# Patient Record
Sex: Male | Born: 2005
Health system: Southern US, Community
[De-identification: ages and names within clinical notes are randomized; demographics above are authoritative.]

## PROBLEM LIST (undated history)

## (undated) DIAGNOSIS — IMO0001 Reserved for inherently not codable concepts without codable children: Secondary | ICD-10-CM

## (undated) DIAGNOSIS — K219 Gastro-esophageal reflux disease without esophagitis: Secondary | ICD-10-CM

## (undated) HISTORY — DX: Reserved for inherently not codable concepts without codable children: IMO0001

## (undated) HISTORY — DX: Gastro-esophageal reflux disease without esophagitis: K21.9

## (undated) HISTORY — PX: BRONCHOSCOPY: SUR163

---
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
## 2023-01-06 DIAGNOSIS — Z23 Encounter for immunization: Secondary | ICD-10-CM | POA: Diagnosis not present

## 2023-01-06 DIAGNOSIS — R41840 Attention and concentration deficit: Secondary | ICD-10-CM | POA: Diagnosis not present

## 2023-01-06 DIAGNOSIS — Z0000 Encounter for general adult medical examination without abnormal findings: Secondary | ICD-10-CM | POA: Diagnosis not present

## 2023-03-17 DIAGNOSIS — L308 Other specified dermatitis: Secondary | ICD-10-CM | POA: Diagnosis not present

## 2023-03-17 DIAGNOSIS — L720 Epidermal cyst: Secondary | ICD-10-CM | POA: Diagnosis not present

## 2023-03-25 DIAGNOSIS — Z111 Encounter for screening for respiratory tuberculosis: Secondary | ICD-10-CM | POA: Diagnosis not present

## 2023-10-10 DIAGNOSIS — Z23 Encounter for immunization: Secondary | ICD-10-CM | POA: Diagnosis not present

## 2024-06-04 ENCOUNTER — Encounter: Payer: Self-pay | Admitting: Family Medicine

## 2024-06-07 DIAGNOSIS — L0390 Cellulitis, unspecified: Secondary | ICD-10-CM | POA: Diagnosis not present

## 2024-06-07 DIAGNOSIS — Z1159 Encounter for screening for other viral diseases: Secondary | ICD-10-CM | POA: Diagnosis not present

## 2024-06-07 DIAGNOSIS — Z111 Encounter for screening for respiratory tuberculosis: Secondary | ICD-10-CM | POA: Diagnosis not present

## 2024-06-07 DIAGNOSIS — B9562 Methicillin resistant Staphylococcus aureus infection as the cause of diseases classified elsewhere: Secondary | ICD-10-CM | POA: Diagnosis not present

## 2024-08-29 DIAGNOSIS — Z23 Encounter for immunization: Secondary | ICD-10-CM | POA: Diagnosis not present

## 2024-09-26 DIAGNOSIS — F411 Generalized anxiety disorder: Secondary | ICD-10-CM | POA: Diagnosis not present

## 2024-10-15 DIAGNOSIS — S51811A Laceration without foreign body of right forearm, initial encounter: Secondary | ICD-10-CM | POA: Diagnosis not present

## 2024-10-17 DIAGNOSIS — Z23 Encounter for immunization: Secondary | ICD-10-CM | POA: Diagnosis not present

## 2024-11-06 ENCOUNTER — Telehealth: Admitting: Physician Assistant

## 2024-11-06 DIAGNOSIS — R52 Pain, unspecified: Secondary | ICD-10-CM

## 2024-11-06 DIAGNOSIS — Z20828 Contact with and (suspected) exposure to other viral communicable diseases: Secondary | ICD-10-CM | POA: Diagnosis not present

## 2024-11-06 MED ORDER — ALBUTEROL SULFATE HFA 108 (90 BASE) MCG/ACT IN AERS: 2.0000 | INHALATION_SPRAY | Freq: Four times a day (QID) | RESPIRATORY_TRACT | 0 refills | Status: AC | PRN

## 2024-11-06 MED ORDER — OSELTAMIVIR PHOSPHATE 75 MG PO CAPS: 75.0000 mg | ORAL_CAPSULE | Freq: Two times a day (BID) | ORAL | 0 refills | Status: AC

## 2024-11-06 NOTE — Progress Notes (Signed)
 Message sent to patient requesting further input regarding current symptoms. Awaiting patient response.

## 2024-11-06 NOTE — Progress Notes (Signed)
 E visit for Flu like symptoms   We are sorry that you are not feeling well.  Here is how we plan to help! Based on what you have shared with me it looks like you may have a respiratory virus that may be influenza.  Influenza or "the flu" is  an infection caused by a respiratory virus. The flu virus is highly contagious and persons who did not receive their yearly flu vaccination may "catch" the flu from close contact.  We have anti-viral medications to treat the viruses that cause this infection. They are not a "cure" and only shorten the course of the infection. These prescriptions are most effective when they are given within the first 2 days of "flu" symptoms. Antiviral medications are indicated if you have a high risk of complications from the flu. You should  also consider an antiviral medication if you are in close contact with someone who is at risk. These medications can help patients avoid complications from the flu but have side effects that you should know.   Possible side effects from Tamiflu  or oseltamivir  include nausea, vomiting, diarrhea, dizziness, headaches, eye redness, sleep problems or other respiratory symptoms. You should not take Tamiflu  if you have an allergy to oseltamivir  or any to the ingredients in Tamiflu .  Based upon your symptoms and potential risk factors I have prescribed Oseltamivir  (Tamiflu ).  It has been sent to your designated pharmacy.  You will take one 75 mg capsule orally twice a day for the next 5 days.   For nasal congestion, you may use an oral decongestant such as Mucinex D or if you have glaucoma or high blood pressure use plain Mucinex.  Saline nasal spray or nasal drops can help and can safely be used as often as needed for congestion.  If you have a sore or scratchy throat, use a saltwater gargle-  to  teaspoon of salt dissolved in a 4-ounce to 8-ounce glass of warm water.  Gargle the solution for approximately 15-30 seconds and then spit.  It is  important not to swallow the solution.  You can also use throat lozenges/cough drops and Chloraseptic spray to help with throat pain or discomfort.  Warm or cold liquids can also be helpful in relieving throat pain.  For headache, pain or general discomfort, you can use Ibuprofen or Tylenol as directed.   Some authorities believe that zinc sprays or the use of Echinacea may shorten the course of your symptoms.  I have prescribed the following medications to help lessen symptoms: I have prescribed an inhaler - Albuterol  MDI 90 mcg /actuation 2 puffs every 4 hours as needed for shortness of breath, wheezing, cough  You are to isolate at home until you have been fever-free for at least 24 hours without a fever-reducing medication, and symptoms have been steadily improving for 24 hours.  If you must be around other household members who do not have symptoms, you need to make sure that both you and the family members are masking consistently with a high-quality mask.  If you note any worsening of symptoms despite treatment, please seek an in-person evaluation ASAP. If you note any significant shortness of breath or any chest pain, please seek ED evaluation. Please do not delay care!  ANYONE WHO HAS FLU SYMPTOMS SHOULD: Stay home. The flu is highly contagious and going out or to work exposes others! Be sure to drink plenty of fluids. Water is fine as well as fruit juices, sodas and electrolyte beverages.  You may want to stay away from caffeine or alcohol. If you are nauseated, try taking small sips of liquids. How do you know if you are getting enough fluid? Your urine should be a pale yellow or almost colorless. Get rest. Taking a steamy shower or using a humidifier may help nasal congestion and ease sore throat pain. Using a saline nasal spray works much the same way. Cough drops, hard candies and sore throat lozenges may ease your cough. Line up a caregiver. Have someone check on you regularly.  GET  HELP RIGHT AWAY IF: You cannot keep down liquids or your medications. You become short of breath Your fell like you are going to pass out or loose consciousness. Your symptoms persist after you have completed your treatment plan  MAKE SURE YOU  Understand these instructions. Will watch your condition. Will get help right away if you are not doing well or get worse.  Your e-visit answers were reviewed by a board certified advanced clinical practitioner to complete your personal care plan.  Depending on the condition, your plan could have included both over the counter or prescription medications.  If there is a problem please reply  once you have received a response from your provider.  Your safety is important to us .  If you have drug allergies check your prescription carefully.    You can use MyChart to ask questions about today's visit, request a non-urgent call back, or ask for a work or school excuse for 24 hours related to this e-Visit. If it has been greater than 24 hours you will need to follow up with your provider, or enter a new e-Visit to address those concerns.  You will get an e-mail in the next two days asking about your experience.  I hope that your e-visit has been valuable and will speed your recovery. Thank you for using e-visits.   I have spent 5 minutes in review of e-visit questionnaire, review and updating patient chart, medical decision making and response to patient.   Elsie Velma Lunger, PA-C

## 2025-01-17 ENCOUNTER — Encounter: Payer: Self-pay | Admitting: Physician Assistant

## 2025-01-17 ENCOUNTER — Ambulatory Visit: Admitting: Physician Assistant

## 2025-01-17 ENCOUNTER — Other Ambulatory Visit: Payer: Self-pay

## 2025-01-17 VITALS — BP 119/75 | HR 74 | Ht 72.0 in | Wt 192.4 lb

## 2025-01-17 DIAGNOSIS — Z23 Encounter for immunization: Secondary | ICD-10-CM

## 2025-01-17 DIAGNOSIS — Z7689 Persons encountering health services in other specified circumstances: Secondary | ICD-10-CM

## 2025-01-17 DIAGNOSIS — L309 Dermatitis, unspecified: Secondary | ICD-10-CM | POA: Insufficient documentation

## 2025-01-17 DIAGNOSIS — L308 Other specified dermatitis: Secondary | ICD-10-CM

## 2025-01-17 DIAGNOSIS — Z1159 Encounter for screening for other viral diseases: Secondary | ICD-10-CM

## 2025-01-17 DIAGNOSIS — Z114 Encounter for screening for human immunodeficiency virus [HIV]: Secondary | ICD-10-CM

## 2025-01-17 DIAGNOSIS — K219 Gastro-esophageal reflux disease without esophagitis: Secondary | ICD-10-CM

## 2025-01-17 DIAGNOSIS — Z136 Encounter for screening for cardiovascular disorders: Secondary | ICD-10-CM

## 2025-01-17 DIAGNOSIS — Z113 Encounter for screening for infections with a predominantly sexual mode of transmission: Secondary | ICD-10-CM | POA: Insufficient documentation

## 2025-01-17 DIAGNOSIS — Z7189 Other specified counseling: Secondary | ICD-10-CM | POA: Insufficient documentation

## 2025-01-17 DIAGNOSIS — F1290 Cannabis use, unspecified, uncomplicated: Secondary | ICD-10-CM

## 2025-01-17 MED ORDER — PANTOPRAZOLE SODIUM 40 MG PO TBEC
40.0000 mg | DELAYED_RELEASE_TABLET | Freq: Every day | ORAL | 3 refills | Status: AC
  Filled 2025-01-17: qty 30, 30d supply, fill #0

## 2025-01-17 MED ORDER — TRIAMCINOLONE ACETONIDE 0.5 % EX OINT
1.0000 | TOPICAL_OINTMENT | Freq: Two times a day (BID) | CUTANEOUS | 0 refills | Status: AC
  Filled 2025-01-17: qty 15, 30d supply, fill #0

## 2025-01-17 NOTE — Progress Notes (Signed)
 " New patient visit  Patient: Ronald Henderson   DOB: 08-Oct-2006   19 y.o. Male  MRN: [id number] Visit Date: 01/17/2025  Today's healthcare provider: Jolynn Spencer, PA-C   Chief Complaint  Patient presents with   Establish Care    New patient here to establish care Interested in Prep, Chronic Acid reflex    Subjective    [name] is a 19 y.o. male who presents today as a new patient to establish care.  HPI HPI     Establish Care    Additional comments: New patient here to establish care Interested in Prep, Chronic Acid reflex       Last edited by Debby Kinder on 01/17/2025  3:21 PM.      Discussed the use of AI scribe software for clinical note transcription with the patient, who gave verbal consent to proceed.  History of Present Illness Ronald Henderson is a 19 year old male who presents for establishment of care and discussion of chronic acid reflux and potential PrEP initiation.  He has chronic daily acid reflux since childhood, currently controlled with over-the-counter proton pump inhibitors. He is worried about long-term effects of daily antacid use and wants alternative options.  He has eczema on his hands with painful cracking and no itching. He has not been formally diagnosed and manages with Eucerin and gloves to limit chemical exposure.  He is interested in starting PrEP for HIV prevention and has never used PrEP. He is sexually active and did not report partners known to have HIV.  He is a theatre stage manager. He smokes marijuana about five times per week, recently quit nicotine after about four years, and drinks alcohol every other weekend with about six to seven drinks each time. 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 He denies chest pain, shortness of breath, palpitations, or exertional fatigue.    Past Medical History:  Diagnosis Date   Reflux    Past  Surgical History:  Procedure Laterality Date   BRONCHOSCOPY     Family Status  Relation Name Status   Other  (Not Specified)   Other  (Not Specified)  No partnership data on file   Family History  Problem Relation Age of Onset   Heart disease Other    Diabetes Other    Social History   Socioeconomic History   Marital status: Single    Spouse name: Not on file   Number of children: Not on file   Years of education: Not on file   Highest education level: Bachelor's degree (e.g., BA, AB, BS)  Occupational History   Not on file  Tobacco Use   Smoking status: Never   Smokeless tobacco: Never  Substance and Sexual Activity   Alcohol use: Yes    Comment: occ   Drug use: No   Sexual activity: Not on file  Other Topics Concern   Not on file  Social History Narrative   Not on file   Social Drivers of Health   Tobacco Use: Low Risk (01/17/2025)   Patient History    Smoking Tobacco Use: Never    Smokeless Tobacco Use: Never    Passive Exposure: Not on file  Financial Resource Strain: Low Risk (01/16/2025)   Overall Financial Resource Strain (CARDIA)    Difficulty of Paying Living Expenses: Not hard at all  Food Insecurity: No Food Insecurity (01/16/2025)   Epic  Worried About Programme Researcher, Broadcasting/film/video in the Last Year: Never true    Ran Out of Food in the Last Year: Never true  Transportation Needs: No Transportation Needs (01/16/2025)   Epic    Lack of Transportation (Medical): No    Lack of Transportation (Non-Medical): No  Physical Activity: Sufficiently Active (01/16/2025)   Exercise Vital Sign    Days of Exercise per Week: 6 days    Minutes of Exercise per Session: 40 min  Stress: Stress Concern Present (01/16/2025)   [employer] of Occupational [employer] - Occupational Stress Questionnaire    Feeling of Stress: To some extent  Social Connections: Socially Isolated (01/16/2025)   Social Connection and Isolation Panel    Frequency of Communication with Friends and Family:  More than three times a week    Frequency of Social Gatherings with Friends and Family: More than three times a week    Attends Religious Services: Never    Database Administrator or Organizations: No    Attends Engineer, Structural: Not on file    Marital Status: Never married  Depression (PHQ2-9): High Risk (01/17/2025)   Depression (PHQ2-9)    PHQ-2 Score: 13  Alcohol Screen: Medium Risk (01/16/2025)   Alcohol Screen    Last Alcohol Screening Score (AUDIT): 10  Housing: Unknown (01/16/2025)   Epic    Unable to Pay for Housing in the Last Year: No    Number of Times Moved in the Last Year: Not on file    Homeless in the Last Year: No  Utilities: Low Risk (11/18/2024)   Received from [facility]   Utilities    Within the past 12 months, have you been unable to get utilities(heat, electricity) when it was really needed?: No  Health Literacy: Not on file   Show/hide medication list[1] Allergies[2]  Immunization History  Administered Date(s) Administered   DTP 12/15/2005   DTaP 10/15/2004, 12/28/2004, 03/01/2005, 08/06/2009   HIB (PRP-OMP) 10/15/2004, 12/28/2004, 03/01/2005, 10/20/2005   HPV 9-valent 11/08/2016, 05/24/2017   Hepatitis A, Ped/Adol-2 Dose 10/29/2013, 07/15/2014   Hepatitis B 08/16/2004, 10/15/2004, 12/28/2004, 09/03/2009   IPV 10/15/2004, 12/28/2004, 03/01/2005, 08/06/2009   Influenza, Seasonal, Injecte, Preservative Fre 08/29/2024   Influenza,Quad,Nasal, Live 10/29/2013   Influenza,inj,Quad PF,6+ Mos 11/04/2014, 11/04/2015, 11/08/2016, 11/11/2017, 11/19/2019, 11/19/2020   Influenza-Unspecified 12/15/2005   MMR 10/20/2005, 08/06/2009   Meningococcal Conjugate 02/02/2016, 11/19/2020   PFIZER(Purple Top)SARS-COV-2 Vaccination 04/30/2020, 05/21/2020   PPD Test 10/20/2021, 03/14/2023   Pneumococcal Conjugate-13 10/15/2004, 12/28/2004, 03/01/2005, 10/20/2005   Td (Adult),5 Lf Tetanus Toxid, Preservative Free 10/17/2024   Tdap 02/02/2016   Varicella  12/15/2005, 10/29/2013    Health Maintenance  Topic Date Due   HPV VACCINES (3 - Risk male 3-dose series) 09/23/2017   HIV Screening  Never done   Meningococcal B Vaccine (1 of 2 - Standard) Never done   Hepatitis C Screening  Never done   COVID-19 Vaccine (3 - 2025-26 season) 08/13/2024   DTaP/Tdap/Td (8 - Td or Tdap) 10/17/2034   Pneumococcal Vaccine  Completed   Influenza Vaccine  Completed   Hepatitis B Vaccines 19-59 Average Risk  Completed    Patient Care Team: Raynee Mccasland, PA-C as PCP - General (Physician Assistant)  Review of Systems Except see HPI       Objective    BP 119/75 (BP Location: Left Arm, Patient Position: Sitting, Cuff Size: Normal)   Pulse 74   Ht 6' (1.829 m)   Wt 192 lb 6.4 oz (  87.3 kg)   SpO2 100%   BMI 26.09 kg/m     Physical Exam Vitals reviewed.  Constitutional:      General: He is not in acute distress.    Appearance: Normal appearance. He is not diaphoretic.  HENT:     Head: Normocephalic and atraumatic.  Eyes:     General: No scleral icterus.    Conjunctiva/sclera: Conjunctivae normal.  Cardiovascular:     Rate and Rhythm: Normal rate and regular rhythm.     Pulses: Normal pulses.     Heart sounds: Normal heart sounds. No murmur heard. Pulmonary:     Effort: Pulmonary effort is normal. No respiratory distress.     Breath sounds: Normal breath sounds. No wheezing or rhonchi.  Musculoskeletal:     Cervical back: Neck supple.     Right lower leg: No edema.     Left lower leg: No edema.  Lymphadenopathy:     Cervical: No cervical adenopathy.  Skin:    General: Skin is warm and dry.     Findings: No rash.  Neurological:     Mental Status: He is alert and oriented to person, place, and time. Mental status is at baseline.  Psychiatric:        Mood and Affect: Mood normal.        Behavior: Behavior normal.     Depression Screen    01/17/2025    3:28 PM 01/17/2025    3:23 PM 11/19/2020    4:31 PM 11/19/2019   11:08 AM   PHQ 2/9 Scores  PHQ - 2 Score 2 0 2 0  PHQ- 9 Score 13  6  0   Exception Documentation Medical reason Medical reason       Data saved with a previous flowsheet row definition   No results found for any visits on 01/17/25.  Assessment & Plan      Assessment & Plan Encounter for counseling before starting and about pre-exposure prophylaxis for HIV Pre-exposure prophylaxis (PrEP) initiation and HIV/STD screening He considered PrEP due to potential HIV exposure risk and was informed about its benefits. - Ordered HIV and STD screening labs. - Ordered hepatitis B screening. - Will initiate PrEP after lab results are reviewed.  Gastroesophageal reflux disease Chronic and unstable GERD with daily symptoms. Discussed potential side effects of long-term antacid use. - Prescribed pantoprazole  40 mg, start with half tablets. - Advised dietary modifications to avoid trigger foods. - Recommended lifestyle modifications such as not lying down immediately after eating and maintaining a 80% fullness level. Consider a referral to GI if symptoms persist or worsen Will follow-up  Dermatitis of hands Chronic dermatitis on hands, painful when cracking. Discussed the importance of using moisturizers and avoiding irritants. - Prescribed Triamcinolone  cream. - Advised continued use of Eucerin and protective gloves. Advised to: Avoid irritants and allergens, and use moisturizers daily. Apply topical corticosteroids For severe or refractory cases, consider phototherapy (UVA or nbUVB). Delgocitinib (Anzupgo) can be used for moderate to severe chronic hand eczema, applied topically twice daily if top steroid will fail Consider a dermatology referral Will follow-up  General health maintenance He is interested in vaccinations and open to receiving all available vaccines. - Administered HPV vaccine. - Plan for meningococcal vaccine at a later date.   Gastroesophageal reflux disease without  esophagitis (Primary)  - pantoprazole  (PROTONIX ) 40 MG tablet; Take 1 tablet (40 mg total) by mouth daily.  Dispense: 30 tablet; Refill: 3  Need for HPV vaccination  -  HPV 9-valent vaccine,Recombinat  Need for hepatitis C screening test  - Hepatitis C antibody  Encounter for screening for HIV  - HIV Antibody (routine testing w rflx)  Encounter for special screening examination for cardiovascular disorder  - Lipid panel  Screening for STD (sexually transmitted disease)  - CBC with Differential/Platelet - Comprehensive metabolic panel with GFR - Hepatitis, Acute - Urine cytology ancillary only - RPR  Other eczema  - triamcinolone  ointment (KENALOG ) 0.5 %; Apply 1 Application topically 2 (two) times daily.  Dispense: 30 g; Refill: 0   Marijuana smoker X 5 years Former cigarette smoker X 4 years Cessation advised Will follow-up   Encounter to establish care Welcomed to our clinic Reviewed past medical hx, social hx, family hx and surgical hx Pt advised to send all vaccination records or screening   No follow-ups on file.    The patient was advised to call back or seek an in-person evaluation if the symptoms worsen or if the condition fails to improve as anticipated.  I discussed the assessment and treatment plan with the patient. The patient was provided an opportunity to ask questions and all were answered. The patient agreed with the plan and demonstrated an understanding of the instructions.  I, Madalyn Legner, PA-C have reviewed all documentation for this visit. The documentation on  01/17/2025   for the exam, diagnosis, procedures, and orders are all accurate and complete.  Jolynn Spencer, [facility], MMS [facility] [phone] (phone) [phone] (fax)  [facility]     [1]  Outpatient Medications Prior to Visit  Medication Sig   omeprazole  (PRILOSEC) 20 MG capsule TAKE ONE CAPSULE BY MOUTH DAILY.   albuterol  (VENTOLIN  HFA) 108  (90 Base) MCG/ACT inhaler Inhale 2 puffs into the lungs every 6 (six) hours as needed for wheezing or shortness of breath. (Patient not taking: Reported on 01/17/2025)   clindamycin  (CLEOCIN  T) 1 % external solution Apply 1 application topically 2 (two) times daily as needed. (Patient not taking: Reported on 01/17/2025)   clindamycin  (CLEOCIN ) 300 MG capsule Take 1 capsule (300 mg total) by mouth 2 (two) times daily. (Patient not taking: Reported on 01/17/2025)   No facility-administered medications prior to visit.  [2] No Known Allergies  "

## 2025-01-18 LAB — COMPREHENSIVE METABOLIC PANEL WITH GFR
ALT: 60 [IU]/L — ABNORMAL HIGH (ref 0–44)
AST: 38 [IU]/L (ref 0–40)
Albumin: 5 g/dL (ref 4.3–5.2)
Alkaline Phosphatase: 74 [IU]/L (ref 51–125)
BUN/Creatinine Ratio: 12 (ref 9–20)
BUN: 11 mg/dL (ref 6–20)
Bilirubin Total: 0.6 mg/dL (ref 0.0–1.2)
CO2: 22 mmol/L (ref 20–29)
Calcium: 10 mg/dL (ref 8.7–10.2)
Chloride: 102 mmol/L (ref 96–106)
Creatinine, Ser: 0.91 mg/dL (ref 0.76–1.27)
Globulin, Total: 2.2 g/dL (ref 1.5–4.5)
Glucose: 101 mg/dL — ABNORMAL HIGH (ref 70–99)
Potassium: 4 mmol/L (ref 3.5–5.2)
Sodium: 139 mmol/L (ref 134–144)
Total Protein: 7.2 g/dL (ref 6.0–8.5)
eGFR: 124 mL/min/{1.73_m2}

## 2025-01-18 LAB — CBC WITH DIFFERENTIAL/PLATELET
Basophils Absolute: 0 10*3/uL (ref 0.0–0.2)
Basos: 0 %
EOS (ABSOLUTE): 0.1 10*3/uL (ref 0.0–0.4)
Eos: 1 %
Hematocrit: 45.8 % (ref 37.5–51.0)
Hemoglobin: 15.3 g/dL (ref 13.0–17.7)
Immature Grans (Abs): 0 10*3/uL (ref 0.0–0.1)
Immature Granulocytes: 0 %
Lymphocytes Absolute: 2.4 10*3/uL (ref 0.7–3.1)
Lymphs: 33 %
MCH: 28.8 pg (ref 26.6–33.0)
MCHC: 33.4 g/dL (ref 31.5–35.7)
MCV: 86 fL (ref 79–97)
Monocytes Absolute: 0.3 10*3/uL (ref 0.1–0.9)
Monocytes: 4 %
Neutrophils Absolute: 4.4 10*3/uL (ref 1.4–7.0)
Neutrophils: 61 %
Platelets: 318 10*3/uL (ref 150–450)
RBC: 5.31 x10E6/uL (ref 4.14–5.80)
RDW: 13.2 % (ref 11.6–15.4)
WBC: 7.3 10*3/uL (ref 3.4–10.8)

## 2025-01-18 LAB — HCV INTERPRETATION

## 2025-01-18 LAB — ACUTE VIRAL HEPATITIS (HAV, HBV, HCV)
HCV Ab: NONREACTIVE
Hep A IgM: NEGATIVE
Hep B C IgM: NEGATIVE
Hepatitis B Surface Ag: NEGATIVE

## 2025-01-18 LAB — HEPATITIS C ANTIBODY

## 2025-01-18 LAB — HIV ANTIBODY (ROUTINE TESTING W REFLEX): HIV Screen 4th Generation wRfx: NONREACTIVE

## 2025-01-18 LAB — SYPHILIS: RPR W/REFLEX TO RPR TITER AND TREPONEMAL ANTIBODIES, TRADITIONAL SCREENING AND DIAGNOSIS ALGORITHM: RPR Ser Ql: NONREACTIVE

## 2025-03-05 ENCOUNTER — Encounter: Admitting: Physician Assistant
